# Patient Record
Sex: Male | Born: 1955 | Race: White | Hispanic: No | Marital: Married | Smoking: Never smoker
Health system: Southern US, Community
[De-identification: ages and names within clinical notes are randomized; demographics above are authoritative.]

## PROBLEM LIST (undated history)

## (undated) DIAGNOSIS — E119 Type 2 diabetes mellitus without complications: Secondary | ICD-10-CM

## (undated) DIAGNOSIS — I10 Essential (primary) hypertension: Secondary | ICD-10-CM

## (undated) HISTORY — PX: TRIGGER FINGER RELEASE: SHX641

---

## 2008-01-31 ENCOUNTER — Ambulatory Visit: Payer: Self-pay | Admitting: Gastroenterology

## 2008-02-01 ENCOUNTER — Ambulatory Visit: Payer: Self-pay | Admitting: Gastroenterology

## 2014-06-06 ENCOUNTER — Ambulatory Visit: Payer: Self-pay | Admitting: Anesthesiology

## 2014-06-06 LAB — BASIC METABOLIC PANEL
Anion Gap: 5 — ABNORMAL LOW (ref 7–16)
BUN: 20 mg/dL
CHLORIDE: 106 mmol/L
Calcium, Total: 9.7 mg/dL
Co2: 27 mmol/L
Creatinine: 0.99 mg/dL
EGFR (African American): >60
Glucose: 206 mg/dL — ABNORMAL HIGH
Potassium: 4.2 mmol/L
Sodium: 138 mmol/L

## 2014-06-08 ENCOUNTER — Ambulatory Visit
Admit: 2014-06-08 | Disposition: A | Discharge status: Home / Self Care | Payer: Self-pay | Attending: Surgery | Admitting: Surgery

## 2014-07-09 NOTE — Op Note (Signed)
PATIENT NAME:  Bradley GaskinsKULIK, Strider F MR#:  [id number] DATE OF BIRTH:  1955-07-14  DATE OF PROCEDURE:  06/08/2014  PREOPERATIVE DIAGNOSIS: Umbilical hernia.   POSTOPERATIVE DIAGNOSIS: Umbilical hernia.   PROCEDURE: Umbilical hernia repair.   SURGEON: Adella HareJ. Wilton Claudie Brickhouse, MD   ANESTHESIA: General.   INDICATIONS: This 59 year old male has approximately a year history of bulging at the umbilicus. Did have an episode of pain and was able to manually reduce the hernia, which resolved his pain.   He recently has decided to have the hernia repaired and surgery was recommended for definitive treatment.   PROCEDURE IN DETAIL:  The patient was placed on the operating table in the supine position under general anesthesia. The abdomen was prepared with ChloraPrep and draped in a sterile manner.   There was an approximately 5 cm bulge at the umbilicus which was manually reduced prior to incision. Next, a transversely oriented supraumbilical curvilinear incision was made, carried down through a thin layer of subcutaneous tissues to encounter an umbilical hernia sac. The sac was dissected free from surrounding structures and dissected free from the fascial ring defect and was reduced into the peritoneal cavity. The fascial ring defect itself was approximately 2.8 cm in dimension. The properitoneal fat was dissected away from the fascial ring defect circumferentially, extending back approximately 1.5 cm.  There was one  vein which bled and was suture ligated with 0 Surgilon. Next, a Bard soft mesh was cut to create an oval shape of 3 x 5 cm and was placed into the properitoneal plane, sutured to the overlying fascia with through and through 0 Surgilon sutures with 6 point fixation. Next, the repair was carried out with a transversely oriented suture line of interrupted 0 Surgilon figure-of-eight sutures incorporating each suture into the mesh. The repair looked good. The deep fascia and subcutaneous tissues were infiltrated  with 0.5% Sensorcaine with epinephrine.   The skin of the umbilicus was sutured to the deep fascia with several interrupted 3-0 chromic sutures. Next, the skin was closed with a 5-0 Monocryl subcuticular suture and LiquiBand. The patient tolerated surgery satisfactorily and was prepared for transfer to the recovery room.     ____________________________ Shela CommonsJ. Renda RollsWilton Ranay Ketter, MD jws:tr D: 06/08/2014 08:56:31 ET T: 06/08/2014 12:01:08 ET JOB#: [id number]  cc: Adella HareJ. Wilton Grayden Burley, MD, <Dictator> Adella HareWILTON J Thompson Mckim MD ELECTRONICALLY SIGNED 06/14/2014 16:25

## 2017-01-23 DIAGNOSIS — R1084 Generalized abdominal pain: Secondary | ICD-10-CM | POA: Diagnosis not present

## 2017-01-23 DIAGNOSIS — R0789 Other chest pain: Secondary | ICD-10-CM | POA: Diagnosis not present

## 2017-01-23 DIAGNOSIS — Z125 Encounter for screening for malignant neoplasm of prostate: Secondary | ICD-10-CM | POA: Diagnosis not present

## 2017-01-23 DIAGNOSIS — Z0000 Encounter for general adult medical examination without abnormal findings: Secondary | ICD-10-CM | POA: Diagnosis not present

## 2017-01-23 DIAGNOSIS — E1121 Type 2 diabetes mellitus with diabetic nephropathy: Secondary | ICD-10-CM | POA: Diagnosis not present

## 2017-01-27 ENCOUNTER — Other Ambulatory Visit: Payer: Self-pay | Admitting: Internal Medicine

## 2017-01-27 DIAGNOSIS — R1084 Generalized abdominal pain: Secondary | ICD-10-CM

## 2017-02-05 DIAGNOSIS — R0789 Other chest pain: Secondary | ICD-10-CM | POA: Diagnosis not present

## 2017-02-06 ENCOUNTER — Ambulatory Visit
Admission: RE | Admit: 2017-02-06 | Discharge: 2017-02-06 | Disposition: A | Discharge status: Home / Self Care | Payer: 59 | Source: Ambulatory Visit | Attending: Internal Medicine | Admitting: Internal Medicine

## 2017-02-06 DIAGNOSIS — R1084 Generalized abdominal pain: Secondary | ICD-10-CM | POA: Diagnosis present

## 2017-02-06 DIAGNOSIS — I714 Abdominal aortic aneurysm, without rupture: Secondary | ICD-10-CM | POA: Insufficient documentation

## 2017-07-10 DIAGNOSIS — E119 Type 2 diabetes mellitus without complications: Secondary | ICD-10-CM | POA: Diagnosis not present

## 2017-07-10 DIAGNOSIS — H524 Presbyopia: Secondary | ICD-10-CM | POA: Diagnosis not present

## 2017-07-30 DIAGNOSIS — D72829 Elevated white blood cell count, unspecified: Secondary | ICD-10-CM | POA: Diagnosis not present

## 2017-08-06 DIAGNOSIS — E1121 Type 2 diabetes mellitus with diabetic nephropathy: Secondary | ICD-10-CM | POA: Diagnosis not present

## 2017-08-06 DIAGNOSIS — R05 Cough: Secondary | ICD-10-CM | POA: Diagnosis not present

## 2018-04-09 DIAGNOSIS — Z125 Encounter for screening for malignant neoplasm of prostate: Secondary | ICD-10-CM | POA: Diagnosis not present

## 2018-04-09 DIAGNOSIS — E1121 Type 2 diabetes mellitus with diabetic nephropathy: Secondary | ICD-10-CM | POA: Diagnosis not present

## 2018-04-16 DIAGNOSIS — Z0000 Encounter for general adult medical examination without abnormal findings: Secondary | ICD-10-CM | POA: Diagnosis not present

## 2018-04-16 DIAGNOSIS — E1121 Type 2 diabetes mellitus with diabetic nephropathy: Secondary | ICD-10-CM | POA: Diagnosis not present

## 2019-07-09 IMAGING — US US ABDOMEN COMPLETE
1 series · 13 of 25 positions shown · non-contrast
Comparison: None.

CLINICAL DATA: Generalized abdominal pain.

EXAM:
ABDOMEN ULTRASOUND COMPLETE

[Series 1: us abdomen complete · 0.23mm/px · 13 of 100 slices shown]
[im 1/100]
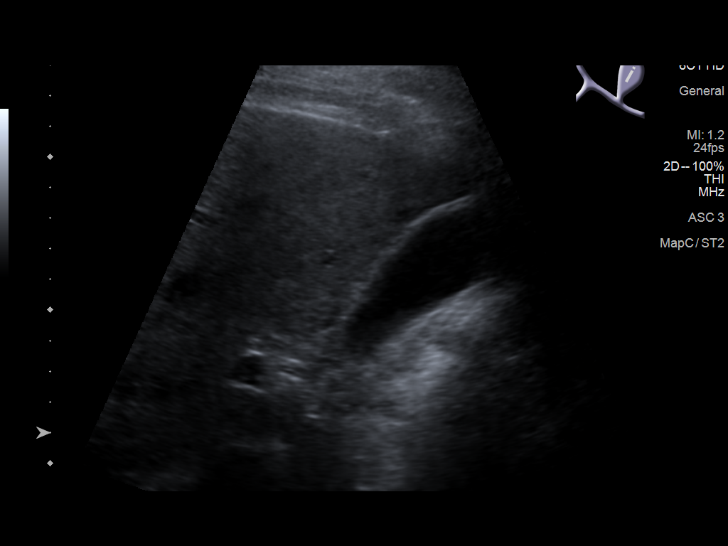
[im 9/100]
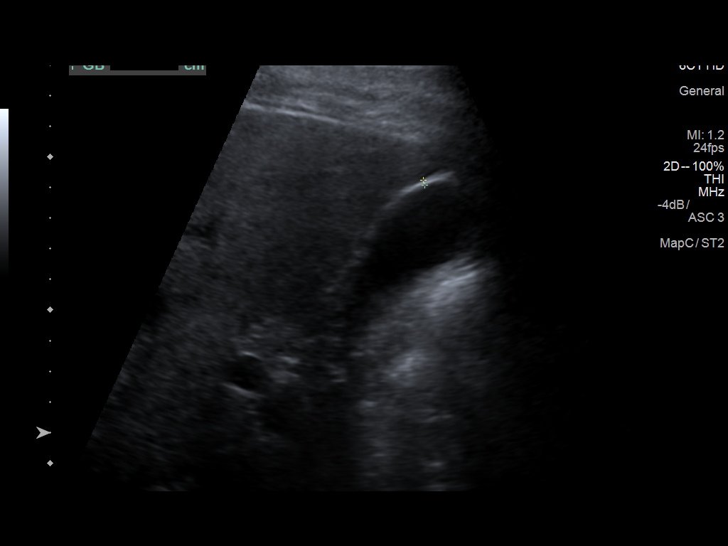
[im 17/100]
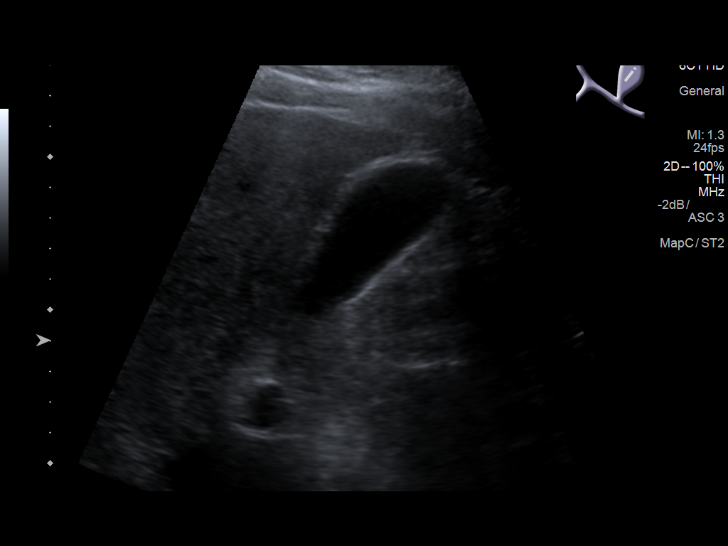
[im 25/100]
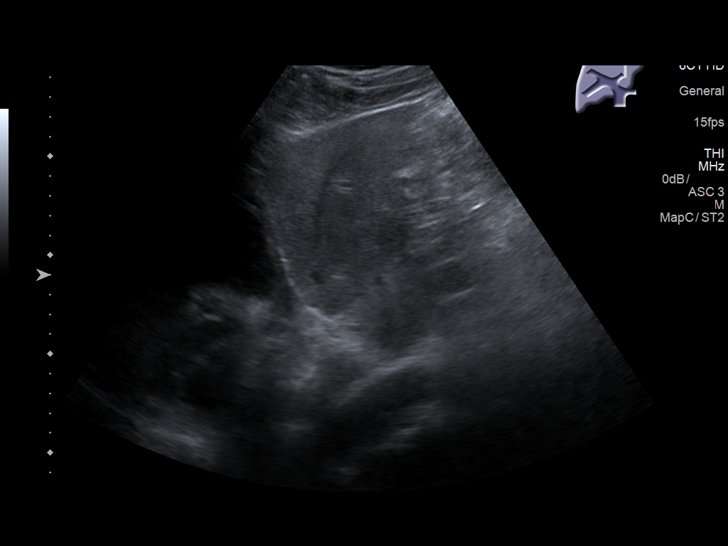
[im 34/100]
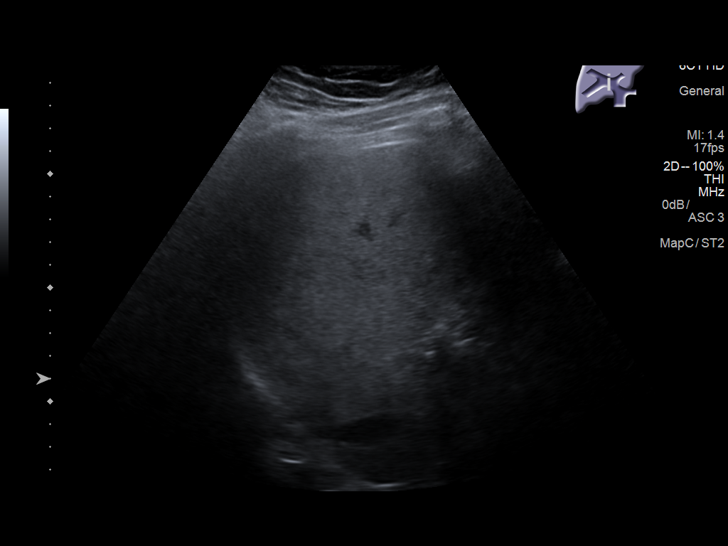
[im 42/100]
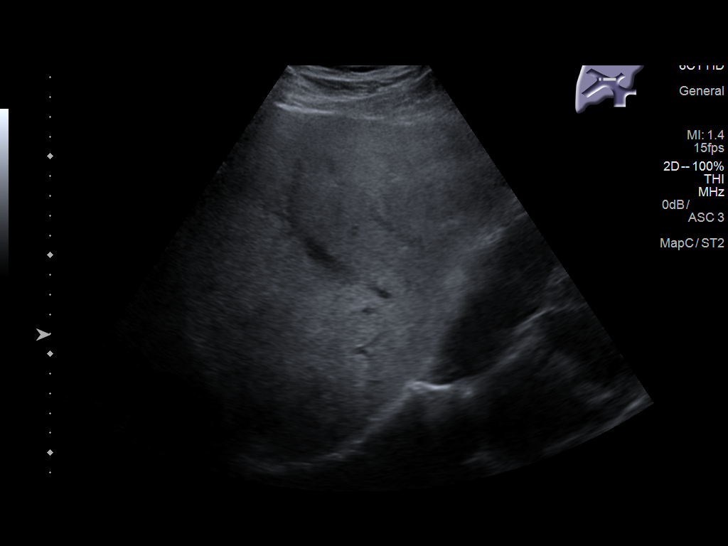
[im 50/100]
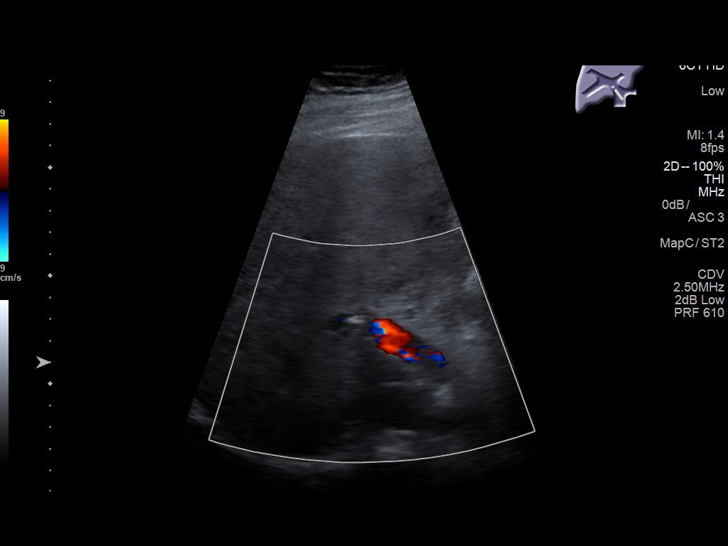
[im 58/100]
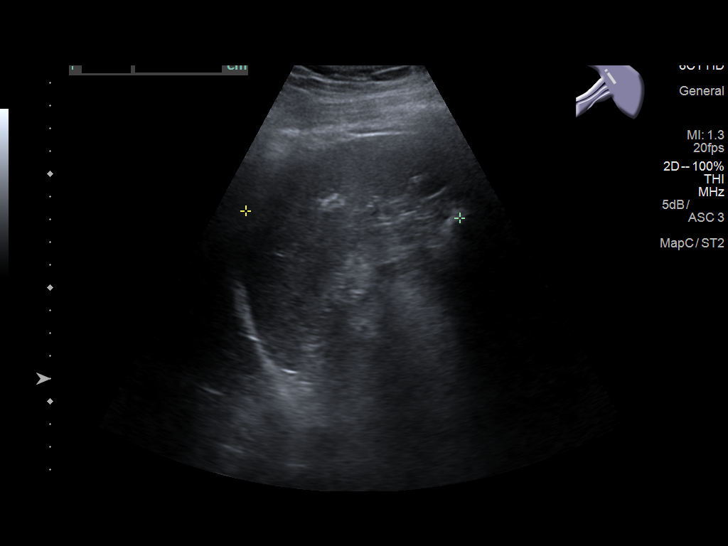
[im 67/100]
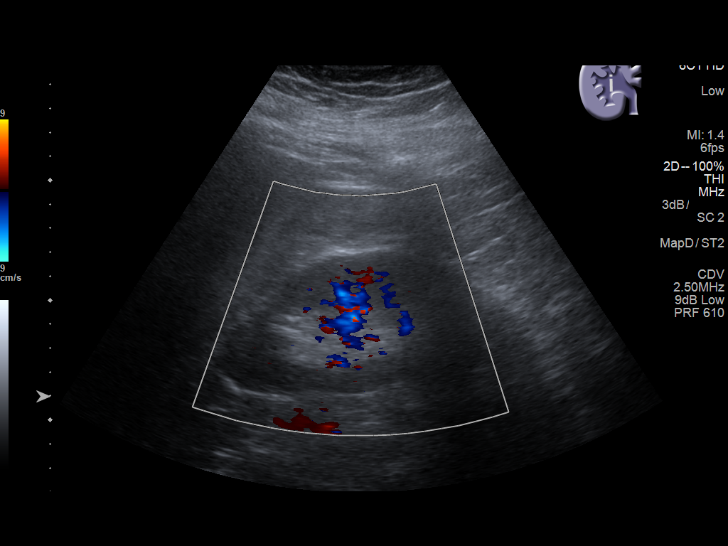
[im 75/100]
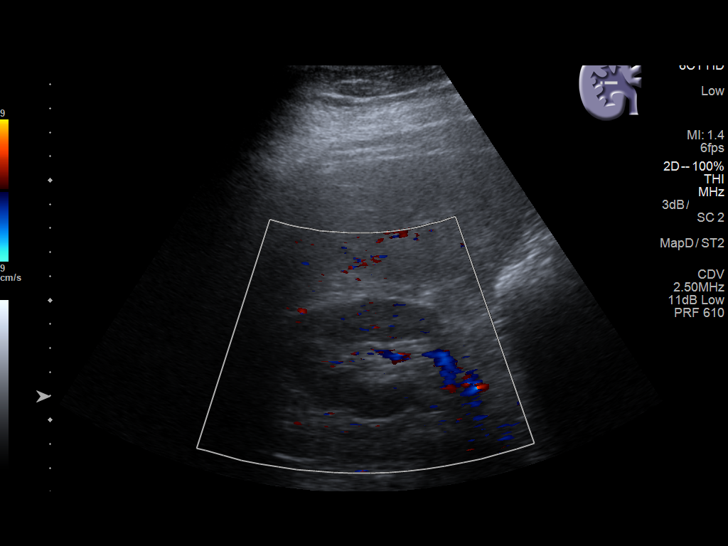
[im 83/100]
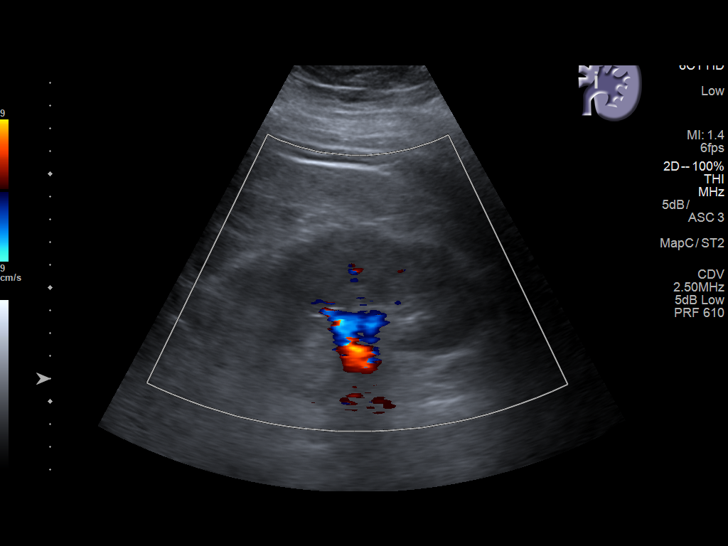
[im 91/100]
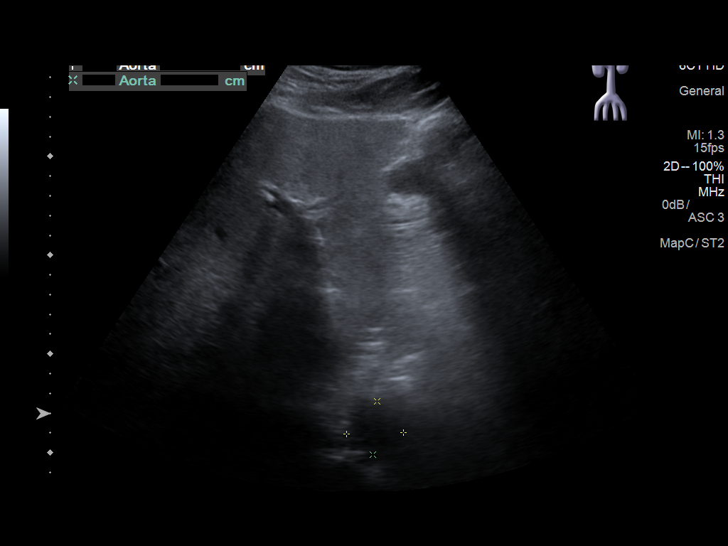
[im 100/100]
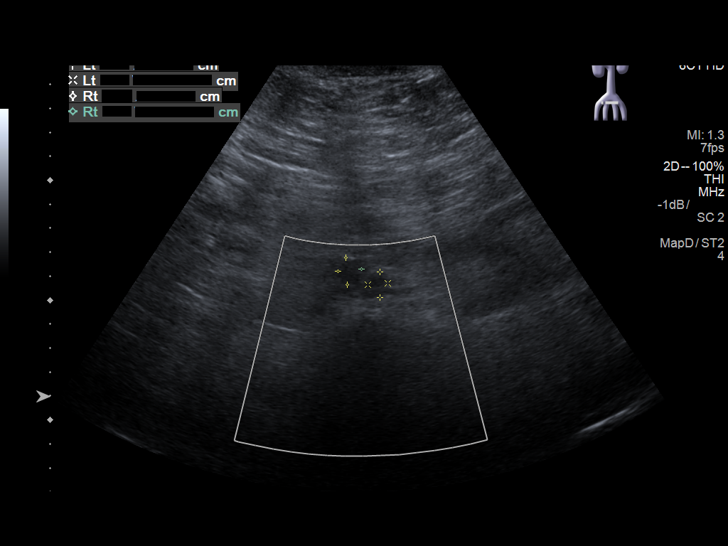

[13 of 25 positions shown; findings below may reference images not displayed]

FINDINGS: Gallbladder: No gallstones or wall thickening visualized. No
sonographic Murphy sign noted by sonographer.

Common bile duct: Diameter: 4.4 mm which is within normal limits.

Liver: No focal lesion identified. Increased echogenicity of hepatic
parenchyma is noted. Portal vein is patent on color Doppler imaging
with normal direction of blood flow towards the liver.

IVC: No abnormality visualized.

Pancreas: Visualized portion unremarkable.

Spleen: Size and appearance within normal limits.

Right Kidney: Length: 13.9 cm. Echogenicity within normal limits. No
mass or hydronephrosis visualized.

Left Kidney: Length: 13.5 cm. 2 cm exophytic cyst is seen arising
from upper pole. Echogenicity within normal limits. No mass or
hydronephrosis visualized.

Abdominal aorta: 3.2 cm proximal abdominal aortic aneurysm is noted.

Other findings: None.
IMPRESSION: 3.2 cm proximal abdominal aortic aneurysm. Recommend followup by
ultrasound in 3 years. This recommendation follows ACR consensus
guidelines: White Paper of the ACR Incidental Findings Committee II
on Vascular Findings. [HOSPITAL] 0135; [DATE].

Increased echogenicity of hepatic parenchyma is noted suggesting
fatty infiltration or other diffuse hepatocellular disease.

No other significant abnormality seen in the abdomen.

## 2019-08-22 ENCOUNTER — Other Ambulatory Visit: Payer: Self-pay | Admitting: Internal Medicine

## 2019-08-22 DIAGNOSIS — Z8249 Family history of ischemic heart disease and other diseases of the circulatory system: Secondary | ICD-10-CM

## 2019-08-22 DIAGNOSIS — E1121 Type 2 diabetes mellitus with diabetic nephropathy: Secondary | ICD-10-CM

## 2020-03-26 ENCOUNTER — Other Ambulatory Visit: Payer: Self-pay

## 2020-03-26 ENCOUNTER — Ambulatory Visit
Admission: EM | Admit: 2020-03-26 | Discharge: 2020-03-26 | Disposition: A | Discharge status: Home / Self Care | Payer: 59

## 2020-03-26 DIAGNOSIS — L02811 Cutaneous abscess of head [any part, except face]: Secondary | ICD-10-CM

## 2020-03-26 HISTORY — DX: Essential (primary) hypertension: I10

## 2020-03-26 HISTORY — DX: Type 2 diabetes mellitus without complications: E11.9

## 2020-03-26 MED ORDER — DOXYCYCLINE HYCLATE 100 MG PO CAPS: 100.0000 mg | ORAL_CAPSULE | Freq: Two times a day (BID) | ORAL | 0 refills | Status: AC

## 2020-03-26 NOTE — ED Triage Notes (Signed)
Pt states a week ago he noticed what he thought was a hair follicle bump to the back of his scalp.  Area continued to grow and swell, become red and sore to touch.

## 2020-03-26 NOTE — ED Provider Notes (Signed)
[facility]    CSN: [id number] Arrival date & time: 03/26/20  1502      History   Chief Complaint Chief Complaint  Patient presents with  . bump on scalp    HPI Bradley Abbott. is a 65 y.o. male who presents with a bump on his scalp that is getting larger and more tender x 1 week. Started about dime size and has gotten larger. Has something similar above his scalp mole a few weeks ago, and his wife thought it was an ingrown hair and pulled the hair, he drained some clear liquid and resolved. This feels different. Denies hx of staph or MRSA.    Past Medical History:  Diagnosis Date  . Diabetes mellitus without complication (HCC)   . Hypertension     There are no problems to display for this patient.   Past Surgical History:  Procedure Laterality Date  . TRIGGER FINGER RELEASE         Home Medications    Prior to Admission medications   Medication Sig Start Date End Date Taking? Authorizing Provider  doxycycline (VIBRAMYCIN) 100 MG capsule Take 1 capsule (100 mg total) by mouth 2 (two) times daily. 03/26/20  Yes Rodriguez-Southworth, Nettie Elm, PA-C  Dulaglutide (TRULICITY) 0.75 MG/0.5ML SOPN Inject into the skin. 08/19/19  Yes [provider]  rosuvastatin (CRESTOR) 5 MG tablet Take by mouth. 08/19/19 08/18/20 Yes [provider]  amLODipine (NORVASC) 10 MG tablet Take 10 mg by mouth daily. 02/14/20   [provider]  carvedilol (COREG) 25 MG tablet Take by mouth. 03/12/20   [provider]  glimepiride (AMARYL) 4 MG tablet Take 4 mg by mouth 2 (two) times daily. 03/17/20   [provider]  metFORMIN (GLUCOPHAGE) 1000 MG tablet Take 1,000 mg by mouth 2 (two) times daily. 03/01/20   [provider]  tamsulosin (FLOMAX) 0.4 MG CAPS capsule Take 0.4 mg by mouth daily. 03/17/20   [provider]  trandolapril-verapamil Jolene Provost) 4-240 MG tablet Take by mouth. 03/20/20   [provider]    Family  History History reviewed. No pertinent family history.  Social History Social History   Tobacco Use  . Smoking status: Never Smoker  . Smokeless tobacco: Never Used  Vaping Use  . Vaping Use: Never used  Substance Use Topics  . Alcohol use: Yes    Comment: seldom  . Drug use: Never     Allergies   Patient has no known allergies.   Review of Systems Review of Systems  Constitutional: Negative for chills, diaphoresis and fever.  Cardiovascular:       Has hx of white coat HTN  Musculoskeletal: Negative for myalgias.  Skin: Negative for rash and wound.  Hematological: Negative for adenopathy.     Physical Exam Triage Vital Signs ED Triage Vitals  Enc Vitals Group     BP 03/26/20 1625 (!) 173/86     Pulse Rate 03/26/20 1625 73     Resp 03/26/20 1625 18     Temp 03/26/20 1625 97.8 F (36.6 C)     Temp Source 03/26/20 1625 Oral     SpO2 03/26/20 1625 99 %     Weight 03/26/20 1623 275 lb (124.7 kg)     Height 03/26/20 1623 6' (1.829 m)     Head Circumference --      Peak Flow --      Pain Score 03/26/20 1623 4     Pain Loc --  Pain Edu? --      Excl. in GC? --    No data found.  Updated Vital Signs BP (!) 173/86 (BP Location: Left Arm)   Pulse 73   Temp 97.8 F (36.6 C) (Oral)   Resp 18   Ht 6' (1.829 m)   Wt 275 lb (124.7 kg)   SpO2 99%   BMI 37.30 kg/m   Visual Acuity Right Eye Distance:   Left Eye Distance:   Bilateral Distance:    Right Eye Near:   Left Eye Near:    Bilateral Near:     Physical Exam Vitals and nursing note reviewed.  Constitutional:      General: He is not in acute distress.    Appearance: He is obese. He is not toxic-appearing.  HENT:     Right Ear: External ear normal.     Left Ear: External ear normal.  Eyes:     General: No scleral icterus.    Conjunctiva/sclera: Conjunctivae normal.  Pulmonary:     Effort: Pulmonary effort is normal.  Musculoskeletal:        General: Normal range of motion.     Cervical  back: Neck supple.  Lymphadenopathy:     Cervical: No cervical adenopathy.  Skin:    General: Skin is warm and dry.     Comments: Has 4 cm x 7 cm non mobile induration, on L lower scalp region with mild erythema and warmth and tenderness. Has a skin color nevus like lesion right above it of about 1/2 cm x 1/2 cm.   Neurological:     Mental Status: He is alert and oriented to person, place, and time.     Gait: Gait normal.  Psychiatric:        Mood and Affect: Mood normal.        Behavior: Behavior normal.        Thought Content: Thought content normal.        Judgment: Judgment normal.      UC Treatments / Results  Labs (all labs ordered are listed, but only abnormal results are displayed) Labs Reviewed - No data to display  EKG   Radiology No results found.  Procedures Procedures (including critical care time)  Medications Ordered in UC Medications - No data to display  Initial Impression / Assessment and Plan / UC Course  I have reviewed the triage vital signs and the nursing notes. Scalp mass, possibly abscess. I placed him on Doxy. Advised him to follow up with his dermatologist next week if this does not resolve or gets worse. See instructions.  Final Clinical Impressions(s) / UC Diagnoses   Final diagnoses:  Abscess, scalp     Discharge Instructions     Follow up with dermatology In the next week since may need to be cut open and removed. Apply heat to this area 3-5 times a day for 3-4 days, it is an abscess, it may come to a head and drain.     ED Prescriptions    Medication Sig Dispense Auth. Provider   doxycycline (VIBRAMYCIN) 100 MG capsule Take 1 capsule (100 mg total) by mouth 2 (two) times daily. 20 capsule Rodriguez-Southworth, Nettie Elm, PA-C     PDMP not reviewed this encounter.   Garey Ham, [place] 03/26/20 1653

## 2020-03-26 NOTE — Discharge Instructions (Signed)
Follow up with dermatology In the next week since may need to be cut open and removed. Apply heat to this area 3-5 times a day for 3-4 days, it is an abscess, it may come to a head and drain.

## 2022-08-05 ENCOUNTER — Emergency Department
Admission: EM | Admit: 2022-08-05 | Discharge: 2022-08-05 | Disposition: A | Discharge status: Home / Self Care | Payer: Medicare HMO | Attending: Emergency Medicine | Admitting: Emergency Medicine

## 2022-08-05 ENCOUNTER — Other Ambulatory Visit: Payer: Self-pay

## 2022-08-05 DIAGNOSIS — S161XXA Strain of muscle, fascia and tendon at neck level, initial encounter: Secondary | ICD-10-CM | POA: Insufficient documentation

## 2022-08-05 DIAGNOSIS — E119 Type 2 diabetes mellitus without complications: Secondary | ICD-10-CM | POA: Diagnosis not present

## 2022-08-05 DIAGNOSIS — X58XXXA Exposure to other specified factors, initial encounter: Secondary | ICD-10-CM | POA: Diagnosis not present

## 2022-08-05 DIAGNOSIS — M542 Cervicalgia: Secondary | ICD-10-CM | POA: Diagnosis present

## 2022-08-05 DIAGNOSIS — I10 Essential (primary) hypertension: Secondary | ICD-10-CM | POA: Insufficient documentation

## 2022-08-05 MED ORDER — MUSCLE RUB 10-15 % EX CREA: 1.0000 | TOPICAL_CREAM | CUTANEOUS | 0 refills | Status: AC | PRN

## 2022-08-05 MED ORDER — DIAZEPAM 2 MG PO TABS: 2.0000 mg | ORAL_TABLET | Freq: Three times a day (TID) | ORAL | 0 refills | Status: AC | PRN

## 2022-08-05 MED ORDER — LIDOCAINE 5 % EX PTCH
1.0000 | MEDICATED_PATCH | CUTANEOUS | Status: DC
  Administered 2022-08-05: 1 via TRANSDERMAL
  Filled 2022-08-05: qty 1

## 2022-08-05 MED ORDER — KETOROLAC TROMETHAMINE 30 MG/ML IJ SOLN
30.0000 mg | Freq: Once | INTRAMUSCULAR | Status: AC
  Administered 2022-08-05: 30 mg via INTRAMUSCULAR
  Filled 2022-08-05: qty 1

## 2022-08-05 NOTE — ED Triage Notes (Signed)
Pt presents to ED with c/o of L sided neck/shoulder pain. Pt denies injury and states pain has been on going for 2 weeks with no improvement. Pt denies any other s/s. Nad noted.

## 2022-08-05 NOTE — ED Notes (Signed)
See triage note; pt ambulatory to [address] recliner; steady; in NAD; resp reg/unlabored, skin dry and calmly sitting in recliner. Visitor at bedside.

## 2022-08-05 NOTE — Discharge Instructions (Addendum)
Take acetaminophen 650 mg and ibuprofen 400 mg every 6 hours for pain.  Take with food. Use muscle rub and valium as needed for pain and spasms.   Call Dr. Hyacinth Meeker for a follow-up appointment next week to check on your symptoms\  If you experience any new, worsening or unexpected symptoms come back to the emergency department for recheck

## 2022-08-05 NOTE — ED Provider Notes (Signed)
[facility] Provider Note    Event Date/Time   First MD Initiated Contact with Patient 08/05/22 1120     (approximate)   History   Torticollis and Shoulder Pain   HPI  Bradley Abbott. is a 67 y.o. male   Past medical history of diabetes and hypertension who presents to the emergency department with left trapezius and neck soreness atraumatic started approximately 1 week ago and continues to be sore.  Has no fever, mental status changes, no obvious inciting event.  No radiation of pain and no other acute medical complaints.  No motor or sensory changes.  Independent Historian contributed to assessment above: wife at bedside corroborates information given above       Physical Exam   Triage Vital Signs: ED Triage Vitals  Enc Vitals Group     BP 08/05/22 1021 (!) 149/100     Pulse Rate 08/05/22 1021 79     Resp 08/05/22 1021 18     Temp 08/05/22 1021 97.8 F (36.6 C)     Temp Source 08/05/22 1021 Oral     SpO2 08/05/22 1021 97 %     Weight --      Height --      Head Circumference --      Peak Flow --      Pain Score 08/05/22 1022 6     Pain Loc --      Pain Edu? --      Excl. in GC? --     Most recent vital signs: Vitals:   08/05/22 1021  BP: (!) 149/100  Pulse: 79  Resp: 18  Temp: 97.8 F (36.6 C)  SpO2: 97%    General: Awake, no distress.  CV:  Good peripheral perfusion.  Resp:  Normal effort.  Abd:  No distention.  Other:  He has some tightness to the left trapezius and soreness to palpation of the left paraspinal cervical area.  Neurovascular intact to the left upper extremity, appears comfortable mildly hypertensive otherwise vital signs normal.   ED Results / Procedures / Treatments   Labs (all labs ordered are listed, but only abnormal results are displayed) Labs Reviewed - No data to display  PROCEDURES:  Critical Care performed: No  Procedures   MEDICATIONS ORDERED IN ED: Medications  lidocaine  (LIDODERM) 5 % 1 patch (1 patch Transdermal Patch Applied 08/05/22 1135)  ketorolac (TORADOL) 30 MG/ML injection 30 mg (30 mg Intramuscular Given 08/05/22 1135)    IMPRESSION / MDM / ASSESSMENT AND PLAN / ED COURSE  I reviewed the triage vital signs and the nursing notes.                                Patient's presentation is most consistent with acute presentation with potential threat to life or bodily function.  Differential diagnosis includes, but is not limited to, torticollis, cervical strain, trapezius strain, musculoskeletal pain, considered but less likely spinal cord compromise, infection, vascular compromise   The patient is on the cardiac monitor to evaluate for evidence of arrhythmia and/or significant heart rate changes.  MDM: This is a patient who is well-appearing with some tenderness to palpation of the muscles in his left neck and left trapezius most likely musculoskeletal strain or spasm that got better with Toradol injection in the emergency department and will be discharged with pain control, Valium for muscle relaxant and close PMD follow-up.  I doubt any emergent pathology given his otherwise benign exam, doubt cord involvement given no neuro symptoms, and I doubt vascular injuries given neurovascular intact to the extremity         FINAL CLINICAL IMPRESSION(S) / ED DIAGNOSES   Final diagnoses:  Cervical strain, acute, initial encounter     Rx / DC Orders   ED Discharge Orders          Ordered    Menthol-Methyl Salicylate (MUSCLE RUB) 10-15 % CREA  As needed        08/05/22 1159    diazepam (VALIUM) 2 MG tablet  Every 8 hours PRN        08/05/22 1159             Note:  This document was prepared using Dragon voice recognition software and may include unintentional dictation errors.    Pilar Jarvis, MD 08/05/22 1539

## 2022-08-26 ENCOUNTER — Other Ambulatory Visit: Payer: Self-pay | Admitting: Internal Medicine

## 2022-08-26 DIAGNOSIS — G959 Disease of spinal cord, unspecified: Secondary | ICD-10-CM

## 2022-08-27 ENCOUNTER — Ambulatory Visit
Admission: RE | Admit: 2022-08-27 | Discharge: 2022-08-27 | Disposition: A | Discharge status: Home / Self Care | Payer: Medicare HMO | Source: Ambulatory Visit | Attending: Internal Medicine | Admitting: Internal Medicine

## 2022-08-27 DIAGNOSIS — G959 Disease of spinal cord, unspecified: Secondary | ICD-10-CM | POA: Diagnosis present

## 2023-03-23 DIAGNOSIS — R7989 Other specified abnormal findings of blood chemistry: Secondary | ICD-10-CM | POA: Diagnosis not present

## 2023-03-24 DIAGNOSIS — M4712 Other spondylosis with myelopathy, cervical region: Secondary | ICD-10-CM | POA: Diagnosis not present

## 2023-05-01 DIAGNOSIS — E1169 Type 2 diabetes mellitus with other specified complication: Secondary | ICD-10-CM | POA: Diagnosis not present

## 2023-05-01 DIAGNOSIS — R7989 Other specified abnormal findings of blood chemistry: Secondary | ICD-10-CM | POA: Diagnosis not present

## 2023-05-01 DIAGNOSIS — E782 Mixed hyperlipidemia: Secondary | ICD-10-CM | POA: Diagnosis not present

## 2023-05-07 DIAGNOSIS — Z125 Encounter for screening for malignant neoplasm of prostate: Secondary | ICD-10-CM | POA: Diagnosis not present

## 2023-05-07 DIAGNOSIS — R7989 Other specified abnormal findings of blood chemistry: Secondary | ICD-10-CM | POA: Diagnosis not present

## 2023-05-07 DIAGNOSIS — E782 Mixed hyperlipidemia: Secondary | ICD-10-CM | POA: Diagnosis not present

## 2023-05-07 DIAGNOSIS — E1169 Type 2 diabetes mellitus with other specified complication: Secondary | ICD-10-CM | POA: Diagnosis not present

## 2023-05-21 DIAGNOSIS — R7989 Other specified abnormal findings of blood chemistry: Secondary | ICD-10-CM | POA: Diagnosis not present

## 2023-06-01 DIAGNOSIS — R7989 Other specified abnormal findings of blood chemistry: Secondary | ICD-10-CM | POA: Diagnosis not present

## 2023-06-15 DIAGNOSIS — R7989 Other specified abnormal findings of blood chemistry: Secondary | ICD-10-CM | POA: Diagnosis not present

## 2023-06-24 DIAGNOSIS — E119 Type 2 diabetes mellitus without complications: Secondary | ICD-10-CM | POA: Diagnosis not present

## 2023-06-24 DIAGNOSIS — H2513 Age-related nuclear cataract, bilateral: Secondary | ICD-10-CM | POA: Diagnosis not present

## 2023-06-29 DIAGNOSIS — R7989 Other specified abnormal findings of blood chemistry: Secondary | ICD-10-CM | POA: Diagnosis not present

## 2023-07-06 DIAGNOSIS — H25013 Cortical age-related cataract, bilateral: Secondary | ICD-10-CM | POA: Diagnosis not present

## 2023-07-06 DIAGNOSIS — H25043 Posterior subcapsular polar age-related cataract, bilateral: Secondary | ICD-10-CM | POA: Diagnosis not present

## 2023-07-06 DIAGNOSIS — H2511 Age-related nuclear cataract, right eye: Secondary | ICD-10-CM | POA: Diagnosis not present

## 2023-07-06 DIAGNOSIS — H2513 Age-related nuclear cataract, bilateral: Secondary | ICD-10-CM | POA: Diagnosis not present

## 2023-07-06 DIAGNOSIS — E119 Type 2 diabetes mellitus without complications: Secondary | ICD-10-CM | POA: Diagnosis not present

## 2023-07-13 DIAGNOSIS — E538 Deficiency of other specified B group vitamins: Secondary | ICD-10-CM | POA: Diagnosis not present

## 2023-07-13 DIAGNOSIS — R7989 Other specified abnormal findings of blood chemistry: Secondary | ICD-10-CM | POA: Diagnosis not present

## 2023-07-28 DIAGNOSIS — R7989 Other specified abnormal findings of blood chemistry: Secondary | ICD-10-CM | POA: Diagnosis not present

## 2023-08-13 DIAGNOSIS — H2511 Age-related nuclear cataract, right eye: Secondary | ICD-10-CM | POA: Diagnosis not present

## 2023-08-14 DIAGNOSIS — H2512 Age-related nuclear cataract, left eye: Secondary | ICD-10-CM | POA: Diagnosis not present

## 2023-08-14 DIAGNOSIS — H25042 Posterior subcapsular polar age-related cataract, left eye: Secondary | ICD-10-CM | POA: Diagnosis not present

## 2023-08-14 DIAGNOSIS — H25012 Cortical age-related cataract, left eye: Secondary | ICD-10-CM | POA: Diagnosis not present

## 2023-08-21 DIAGNOSIS — H2511 Age-related nuclear cataract, right eye: Secondary | ICD-10-CM | POA: Diagnosis not present

## 2023-08-25 DIAGNOSIS — R7989 Other specified abnormal findings of blood chemistry: Secondary | ICD-10-CM | POA: Diagnosis not present

## 2023-08-27 DIAGNOSIS — H2512 Age-related nuclear cataract, left eye: Secondary | ICD-10-CM | POA: Diagnosis not present

## 2023-09-04 DIAGNOSIS — H2512 Age-related nuclear cataract, left eye: Secondary | ICD-10-CM | POA: Diagnosis not present

## 2023-09-04 DIAGNOSIS — H2511 Age-related nuclear cataract, right eye: Secondary | ICD-10-CM | POA: Diagnosis not present

## 2023-09-08 DIAGNOSIS — R7989 Other specified abnormal findings of blood chemistry: Secondary | ICD-10-CM | POA: Diagnosis not present

## 2023-09-22 DIAGNOSIS — R7989 Other specified abnormal findings of blood chemistry: Secondary | ICD-10-CM | POA: Diagnosis not present

## 2023-10-05 DIAGNOSIS — R7989 Other specified abnormal findings of blood chemistry: Secondary | ICD-10-CM | POA: Diagnosis not present

## 2023-10-20 DIAGNOSIS — R7989 Other specified abnormal findings of blood chemistry: Secondary | ICD-10-CM | POA: Diagnosis not present

## 2023-11-03 DIAGNOSIS — R7989 Other specified abnormal findings of blood chemistry: Secondary | ICD-10-CM | POA: Diagnosis not present

## 2023-11-04 DIAGNOSIS — E782 Mixed hyperlipidemia: Secondary | ICD-10-CM | POA: Diagnosis not present

## 2023-11-04 DIAGNOSIS — E1169 Type 2 diabetes mellitus with other specified complication: Secondary | ICD-10-CM | POA: Diagnosis not present

## 2023-11-04 DIAGNOSIS — Z125 Encounter for screening for malignant neoplasm of prostate: Secondary | ICD-10-CM | POA: Diagnosis not present

## 2023-11-11 DIAGNOSIS — E1169 Type 2 diabetes mellitus with other specified complication: Secondary | ICD-10-CM | POA: Diagnosis not present

## 2023-11-11 DIAGNOSIS — Z79899 Other long term (current) drug therapy: Secondary | ICD-10-CM | POA: Diagnosis not present

## 2023-11-11 DIAGNOSIS — R972 Elevated prostate specific antigen [PSA]: Secondary | ICD-10-CM | POA: Diagnosis not present

## 2023-11-11 DIAGNOSIS — N1831 Chronic kidney disease, stage 3a: Secondary | ICD-10-CM | POA: Diagnosis not present

## 2023-11-11 DIAGNOSIS — E782 Mixed hyperlipidemia: Secondary | ICD-10-CM | POA: Diagnosis not present

## 2023-11-11 DIAGNOSIS — Z0000 Encounter for general adult medical examination without abnormal findings: Secondary | ICD-10-CM | POA: Diagnosis not present

## 2023-11-11 DIAGNOSIS — R7989 Other specified abnormal findings of blood chemistry: Secondary | ICD-10-CM | POA: Diagnosis not present

## 2023-11-11 DIAGNOSIS — D369 Benign neoplasm, unspecified site: Secondary | ICD-10-CM | POA: Diagnosis not present

## 2023-11-17 DIAGNOSIS — R7989 Other specified abnormal findings of blood chemistry: Secondary | ICD-10-CM | POA: Diagnosis not present

## 2023-11-30 DIAGNOSIS — R7989 Other specified abnormal findings of blood chemistry: Secondary | ICD-10-CM | POA: Diagnosis not present

## 2023-12-15 DIAGNOSIS — R7989 Other specified abnormal findings of blood chemistry: Secondary | ICD-10-CM | POA: Diagnosis not present

## 2023-12-29 DIAGNOSIS — R7989 Other specified abnormal findings of blood chemistry: Secondary | ICD-10-CM | POA: Diagnosis not present

## 2024-01-12 DIAGNOSIS — R7989 Other specified abnormal findings of blood chemistry: Secondary | ICD-10-CM | POA: Diagnosis not present

## 2024-01-26 DIAGNOSIS — R7989 Other specified abnormal findings of blood chemistry: Secondary | ICD-10-CM | POA: Diagnosis not present
